# Patient Record
Sex: Male | Born: 1980 | Race: Black or African American | Hispanic: No | Marital: Single | Smoking: Current every day smoker
Health system: Southern US, Community
[De-identification: ages and names within clinical notes are randomized; demographics above are authoritative.]

---

## 2014-08-25 ENCOUNTER — Emergency Department (HOSPITAL_COMMUNITY)
Admission: EM | Admit: 2014-08-25 | Discharge: 2014-08-25 | Disposition: A | Discharge status: Home / Self Care | Payer: Self-pay | Attending: Emergency Medicine | Admitting: Emergency Medicine

## 2014-08-25 ENCOUNTER — Emergency Department (HOSPITAL_COMMUNITY): Payer: Self-pay

## 2014-08-25 ENCOUNTER — Encounter (HOSPITAL_COMMUNITY): Payer: Self-pay | Admitting: Emergency Medicine

## 2014-08-25 DIAGNOSIS — R079 Chest pain, unspecified: Secondary | ICD-10-CM | POA: Insufficient documentation

## 2014-08-25 DIAGNOSIS — F17200 Nicotine dependence, unspecified, uncomplicated: Secondary | ICD-10-CM | POA: Insufficient documentation

## 2014-08-25 LAB — CBC
HCT: 42.4 % (ref 39.0–52.0)
HEMOGLOBIN: 14.5 g/dL (ref 13.0–17.0)
MCH: 28.5 pg (ref 26.0–34.0)
MCHC: 34.2 g/dL (ref 30.0–36.0)
MCV: 83.3 fL (ref 78.0–100.0)
Platelets: 215 10*3/uL (ref 150–400)
RBC: 5.09 MIL/uL (ref 4.22–5.81)
RDW: 13.6 % (ref 11.5–15.5)
WBC: 5.6 10*3/uL (ref 4.0–10.5)

## 2014-08-25 LAB — BASIC METABOLIC PANEL
Anion gap: 13 (ref 5–15)
BUN: 12 mg/dL (ref 6–23)
CHLORIDE: 102 meq/L (ref 96–112)
CO2: 25 mEq/L (ref 19–32)
Calcium: 9.2 mg/dL (ref 8.4–10.5)
Creatinine, Ser: 1.07 mg/dL (ref 0.50–1.35)
GFR calc non Af Amer: >90 mL/min (ref >90)
Glucose, Bld: 87 mg/dL (ref 70–99)
POTASSIUM: 4.1 meq/L (ref 3.7–5.3)
Sodium: 140 mEq/L (ref 137–147)

## 2014-08-25 LAB — I-STAT TROPONIN, ED: TROPONIN I, POC: 0 ng/mL (ref 0.00–0.08)

## 2014-08-25 NOTE — Discharge Instructions (Signed)
Try to quit smoking as we discussed. Follow-up with [facility]. Return to the ED for new or worsening symptoms.

## 2014-08-25 NOTE — Progress Notes (Signed)
[facility] Community Liaison Stacy, ° °Provided pt with a list of primary care resources to help patient establish a pcp.  °

## 2014-08-25 NOTE — ED Provider Notes (Signed)
Medical screening examination/treatment/procedure(s) were performed by non-physician practitioner and as supervising physician I was immediately available for consultation/collaboration.   EKG Interpretation None        Jayanna Kroeger M Sabina Beavers, MD 08/25/14 1624 
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
## 2014-08-25 NOTE — ED Notes (Addendum)
Pt reports to ED with new onset sharp chest pain starting 3 days ago, alternating between right and left chest. C/o weakness and transient SOB, denies n/v, lightheadedness.

## 2015-08-22 ENCOUNTER — Emergency Department (HOSPITAL_COMMUNITY): Payer: Self-pay

## 2015-08-22 ENCOUNTER — Encounter (HOSPITAL_COMMUNITY): Payer: Self-pay | Admitting: Emergency Medicine

## 2015-08-22 ENCOUNTER — Emergency Department (HOSPITAL_COMMUNITY)
Admission: EM | Admit: 2015-08-22 | Discharge: 2015-08-22 | Discharge status: Against Medical Advice | Payer: Self-pay | Attending: Emergency Medicine | Admitting: Emergency Medicine

## 2015-08-22 DIAGNOSIS — K219 Gastro-esophageal reflux disease without esophagitis: Secondary | ICD-10-CM | POA: Insufficient documentation

## 2015-08-22 DIAGNOSIS — R0602 Shortness of breath: Secondary | ICD-10-CM | POA: Insufficient documentation

## 2015-08-22 DIAGNOSIS — R079 Chest pain, unspecified: Secondary | ICD-10-CM | POA: Insufficient documentation

## 2015-08-22 DIAGNOSIS — Z720 Tobacco use: Secondary | ICD-10-CM | POA: Insufficient documentation

## 2015-08-22 NOTE — ED Notes (Signed)
Pt A+ox4, reports onset L ant CP rad into LUE since last night.  Pt also reports c/o "feeling jittery", mild SOB and feeling generally unwell.  Sts worse with laying down.  Speaking full/clear sentences, rr even/un-lab.  Skin PWD.  MAEI, ambulatory with steady gait.  NAD.

## 2015-08-22 NOTE — ED Notes (Signed)
NO answer when pt's name called in the waiting room; unable to locate pt 

## 2015-08-22 NOTE — ED Notes (Signed)
Attempted to call pt from lobby to move to exam room, no answer.

## 2015-08-22 NOTE — ED Provider Notes (Signed)
MSE was initiated and I personally evaluated the patient and placed orders (if any) at  3:29 PM on August 22, 2015.  The patient appears stable so that the remainder of the MSE may be completed by another provider.   EKG Interpretation  Date/Time:  Monday August 22 2015 15:06:45 EDT Ventricular Rate:  80 PR Interval:  132 QRS Duration: 80 QT Interval:  370 QTC Calculation: 426 R Axis:   67 Text Interpretation:  Normal sinus rhythm with sinus arrhythmia Septal infarct , age undetermined Abnormal ECG No significant change was found Confirmed by Patria Mane  MD, Caryn Bee ([id number]) on 08/22/2015 3:28:45 PM        Azalia Bilis, MD 08/22/15 1529

## 2015-08-22 NOTE — ED Notes (Signed)
Pt called for blood draw, no answer.
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
## 2018-03-24 ENCOUNTER — Emergency Department (HOSPITAL_COMMUNITY): Admission: EM | Admit: 2018-03-24 | Discharge: 2018-03-24 | Discharge status: Against Medical Advice | Payer: Self-pay

## 2020-04-04 ENCOUNTER — Ambulatory Visit: Payer: Self-pay | Attending: Internal Medicine

## 2020-04-08 ENCOUNTER — Ambulatory Visit: Payer: Self-pay
